# Patient Record
Sex: Male | Born: 1965 | Hispanic: No | Marital: Married | Smoking: Never smoker
Health system: Southern US, Community
[De-identification: ages and names within clinical notes are randomized; demographics above are authoritative.]

## PROBLEM LIST (undated history)

## (undated) DIAGNOSIS — I10 Essential (primary) hypertension: Secondary | ICD-10-CM

## (undated) HISTORY — PX: FOOT SURGERY: SHX648

---

## 1997-07-08 ENCOUNTER — Inpatient Hospital Stay (HOSPITAL_COMMUNITY): Admission: EM | Admit: 1997-07-08 | Discharge: 1997-07-12 | Payer: Self-pay

## 2006-11-23 ENCOUNTER — Emergency Department (HOSPITAL_COMMUNITY): Admission: EM | Admit: 2006-11-23 | Discharge: 2006-11-23 | Payer: Self-pay | Admitting: Emergency Medicine

## 2008-10-24 IMAGING — CR DG KNEE COMPLETE 4+V*L*
4 series · 4 of 4 positions shown · non-contrast
Comparison: None.

CLINICAL DATA: Pain and swelling in left knee.  Difficulty walking.
 LEFT KNEE ? 4 VIEW ? 11/23/06:

[t knee ap left]
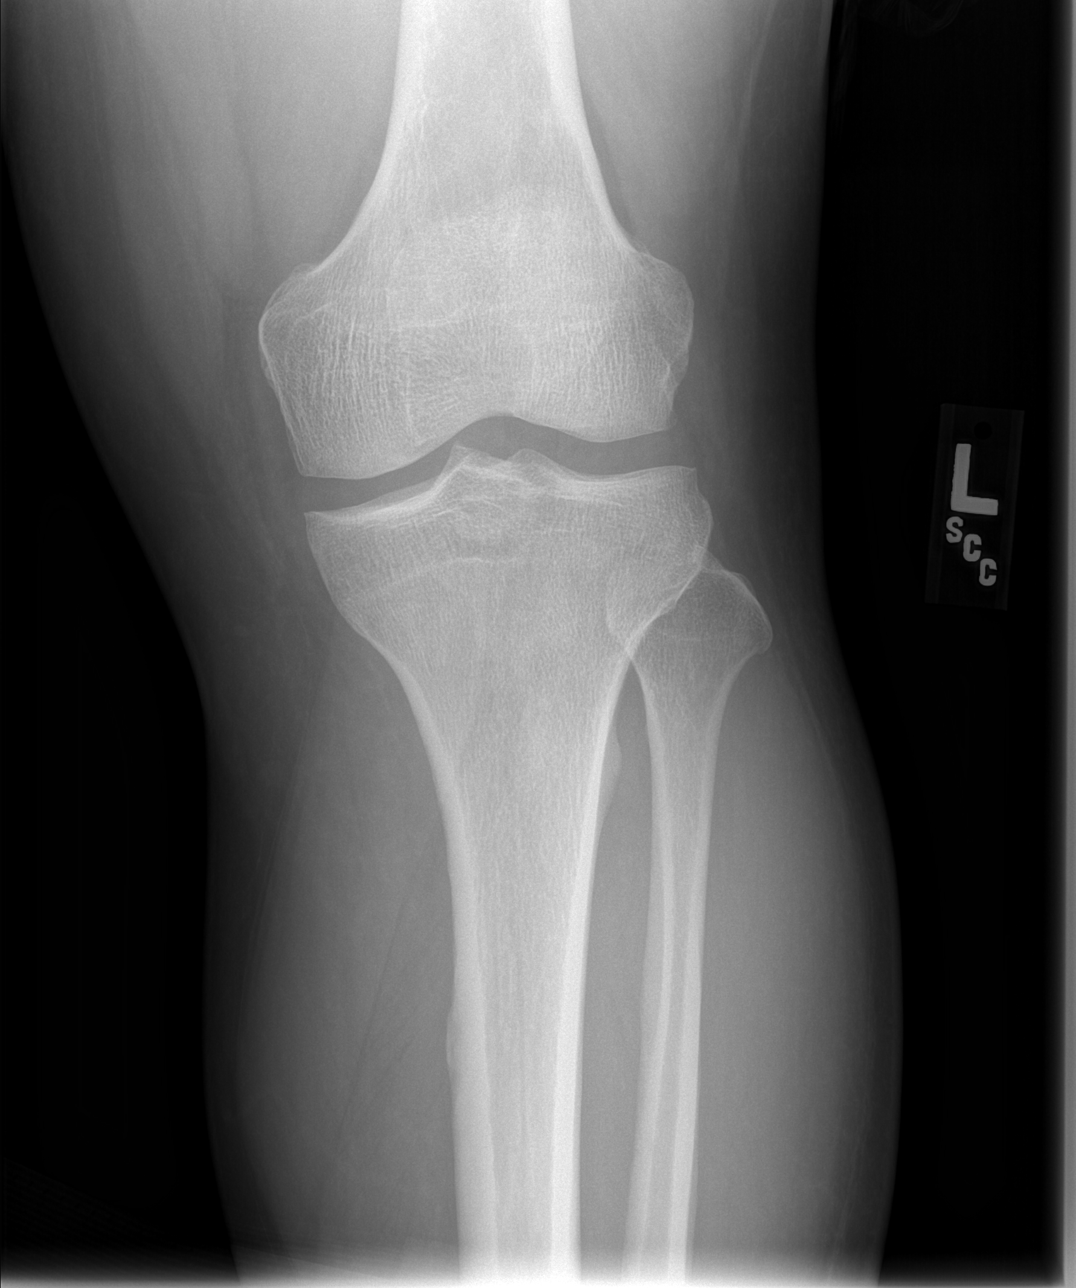

[t knee oblique left (1 of 2)]
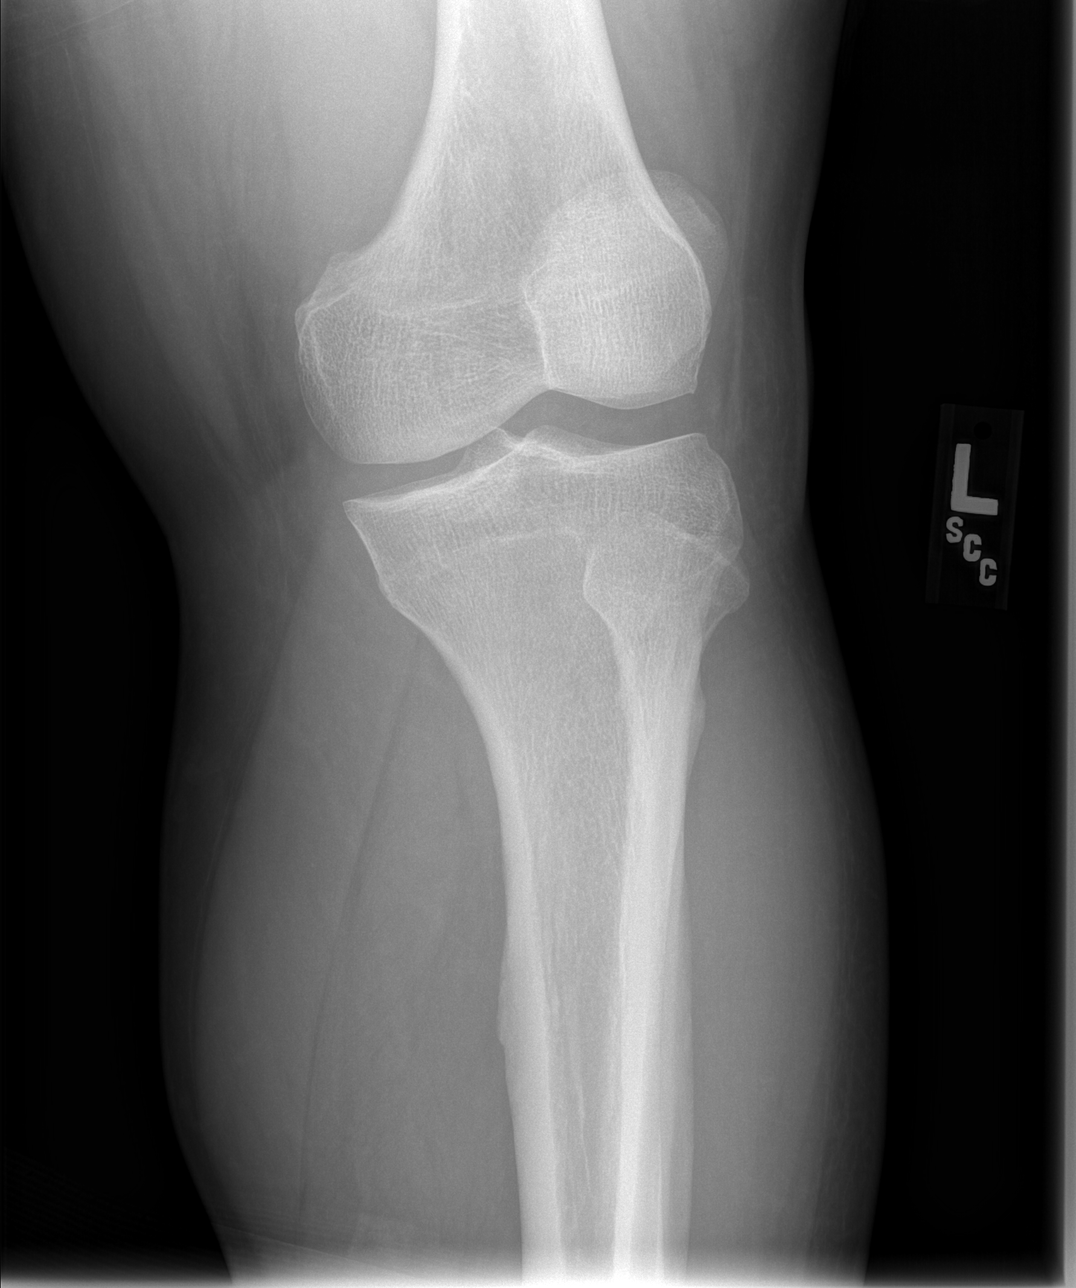

[t knee oblique left (2 of 2)]
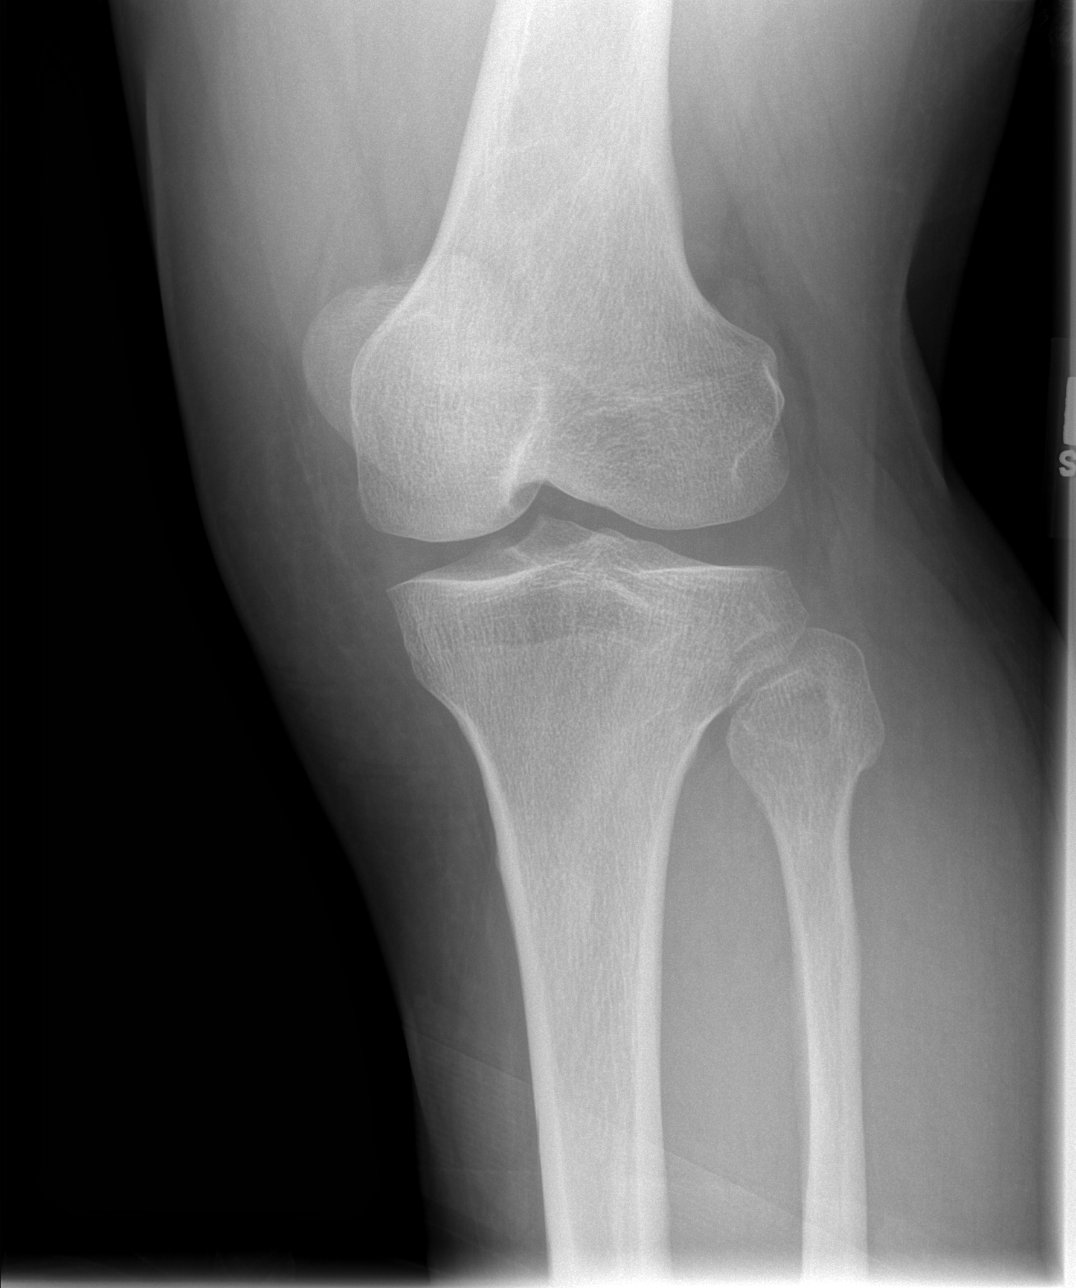

[t knee lat left]
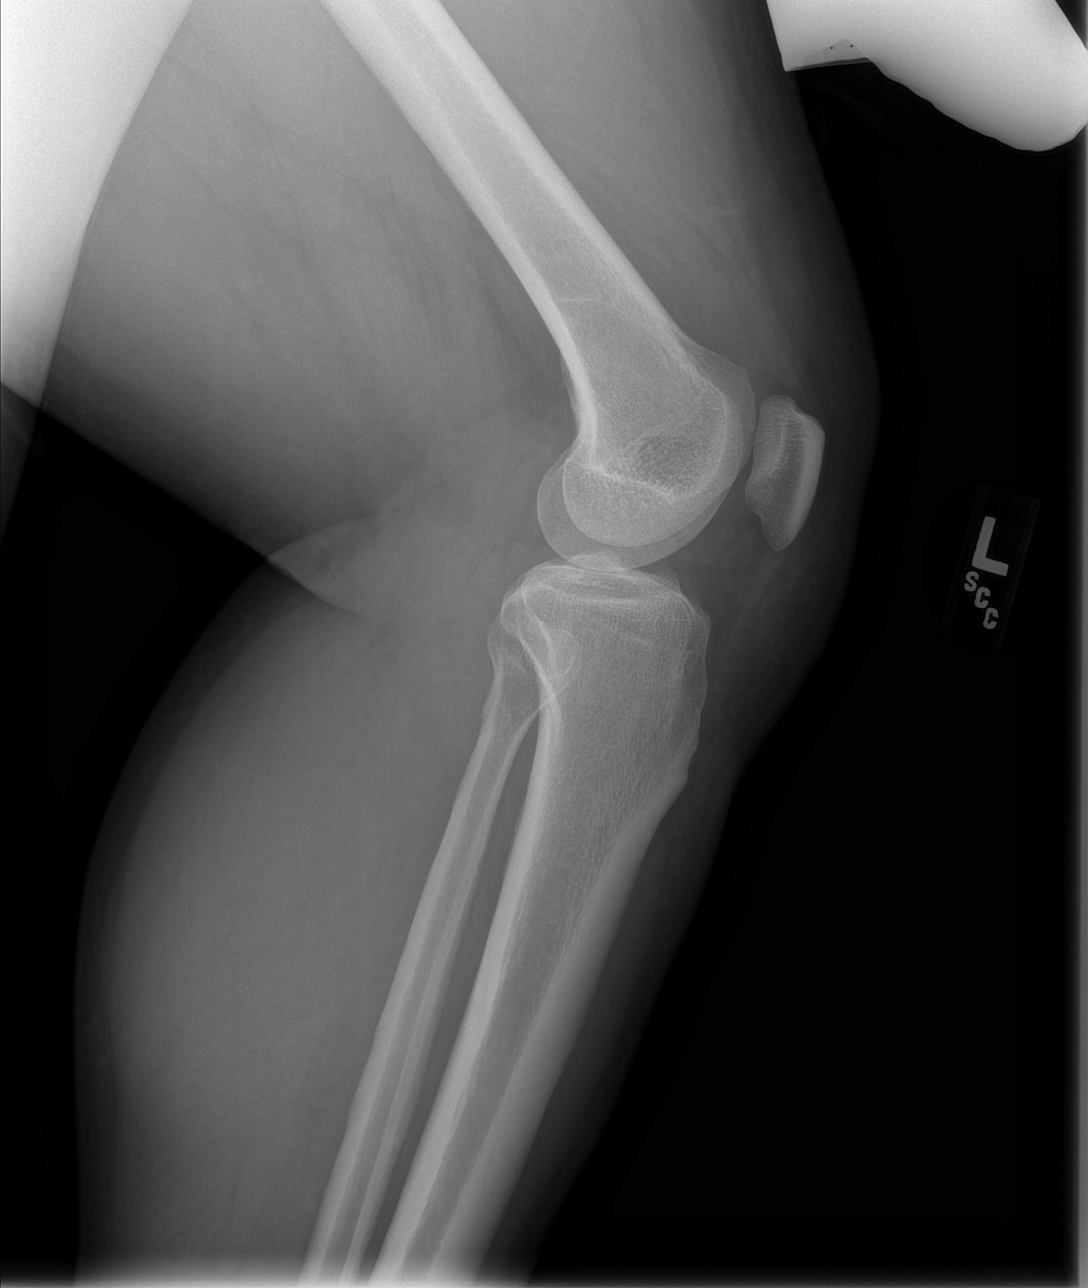

[4 of 4 positions shown; findings below may reference images not displayed]

FINDINGS: No definite joint effusion.  No fracture.  Mild subchondral sclerosis in the medial compartment.
IMPRESSION: No acute findings.   Minimal degenerative change.

## 2015-03-20 ENCOUNTER — Encounter (HOSPITAL_COMMUNITY): Payer: Self-pay

## 2015-03-20 ENCOUNTER — Emergency Department (HOSPITAL_COMMUNITY)
Admission: EM | Admit: 2015-03-20 | Discharge: 2015-03-20 | Disposition: A | Discharge status: Home / Self Care | Payer: Self-pay | Attending: Emergency Medicine | Admitting: Emergency Medicine

## 2015-03-20 DIAGNOSIS — B351 Tinea unguium: Secondary | ICD-10-CM | POA: Insufficient documentation

## 2015-03-20 HISTORY — DX: Essential (primary) hypertension: I10

## 2015-03-20 MED ORDER — FLUCONAZOLE 150 MG PO TABS: 150.0000 mg | ORAL_TABLET | ORAL | Status: DC
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
## 2015-03-20 NOTE — ED Provider Notes (Signed)
CSN: [id number]     Arrival date & time 03/20/15  1012 History   First MD Initiated Contact with Patient 03/20/15 1014     Chief Complaint  Patient presents with  . Hypertension   (Consider location/radiation/quality/duration/timing/severity/associated sxs/prior Treatment) HPI 50 y.o. male with a hx of HTN, presents to the Emergency Department today complaining of high blood pressure x8 months. He has been seen by a PCP for regular check ups every 3 months for refills on his BP medications. He currently takes  HCTZ PO once daily. BP today is 148/80. No headache, no NV. No CP/SOB/ABD pain. No numbness/tingling. Otherwise asymptomatic.  Pt does have additional complaint of a fungus on his right foot. 1st digit. No itching. No pain. Noticed it a few weeks ago.    Past Medical History  Diagnosis Date  . Hypertension    Past Surgical History  Procedure Laterality Date  . Foot surgery     History reviewed. No pertinent family history. Social History  Substance Use Topics  . Smoking status: Never Smoker   . Smokeless tobacco: None  . Alcohol Use: No    Review of Systems ROS reviewed and all are negative for acute change except as noted in the HPI.  Allergies  Review of patient's allergies indicates no known allergies.  Home Medications   Prior to Admission medications   Not on File   BP 177/92 mmHg  Pulse 71  Temp(Src) 97.8 F (36.6 C) (Oral)  Resp 16  SpO2 99%   Physical Exam  Constitutional: He is oriented to person, place, and time. He appears well-developed and well-nourished.  HENT:  Head: Normocephalic and atraumatic.  Eyes: EOM are normal.  Neck: Normal range of motion.  Cardiovascular: Normal rate, regular rhythm and normal heart sounds.   Pulmonary/Chest: Effort normal and breath sounds normal.  Abdominal: Soft.  Musculoskeletal: Normal range of motion.  Neurological: He is alert and oriented to person, place, and time.  Skin: Skin is warm and dry.   Psychiatric: He has a normal mood and affect. His behavior is normal. Thought content normal.  Nursing note and vitals reviewed.   ED Course  Procedures (including critical care time) Labs Review Labs Reviewed - No data to display  Imaging Review No results found. I have personally reviewed and evaluated these images and lab results as part of my medical decision-making.   EKG Interpretation None      MDM  I have reviewed the relevant previous healthcare records. I obtained HPI from historian.  ED Course:  Assessment: 54y M with hx htn presents with high blood pressure. Pt is being followed by a PCP regularly for check ups every 3 months. Pt has medications. Currently takes HCTZ  PO Daily. VSS with BP at 148/80 on exam. No headache, vision changes, CP/SOB. Otherwise asymptomatic Told to follow up with PCP for further management. Told to record blood pressures throughout the day to help PCP with dosages of his medications. Pt also had onychomycosis of right foot 1st digit. Will dc with antifungal. Patient is in no acute distress. Vital Signs are stable. Patient is able to ambulate. Patient able to tolerate PO.    Disposition/Plan:  DC Home Additional Verbal discharge instructions given and discussed with patient.  Pt Instructed to f/u with PCP in the next 48 hours for evaluation and treatment of symptoms. Return precautions given Pt acknowledges and agrees with plan  Supervising Physician Loren Racer, MD   Final diagnoses:  Onychomycosis  Audry Pili, PA-C 03/20/15 1103  Loren Racer, MD 03/20/15 [phone]

## 2016-07-27 ENCOUNTER — Ambulatory Visit (HOSPITAL_COMMUNITY)
Admission: EM | Admit: 2016-07-27 | Discharge: 2016-07-27 | Disposition: A | Discharge status: Home / Self Care | Payer: BLUE CROSS/BLUE SHIELD | Attending: Internal Medicine | Admitting: Internal Medicine

## 2016-07-27 ENCOUNTER — Encounter (HOSPITAL_COMMUNITY): Payer: Self-pay | Admitting: Emergency Medicine

## 2016-07-27 DIAGNOSIS — R739 Hyperglycemia, unspecified: Secondary | ICD-10-CM

## 2016-07-27 DIAGNOSIS — I10 Essential (primary) hypertension: Secondary | ICD-10-CM | POA: Insufficient documentation

## 2016-07-27 DIAGNOSIS — N390 Urinary tract infection, site not specified: Secondary | ICD-10-CM

## 2016-07-27 DIAGNOSIS — R300 Dysuria: Secondary | ICD-10-CM | POA: Diagnosis not present

## 2016-07-27 DIAGNOSIS — Z7984 Long term (current) use of oral hypoglycemic drugs: Secondary | ICD-10-CM | POA: Insufficient documentation

## 2016-07-27 LAB — POCT URINALYSIS DIP (DEVICE)
Bilirubin Urine: NEGATIVE
Glucose, UA: >=1000 mg/dL — AB
Ketones, ur: NEGATIVE mg/dL
NITRITE: NEGATIVE
PH: 5.5 (ref 5.0–8.0)
PROTEIN: NEGATIVE mg/dL
Specific Gravity, Urine: 1.01 (ref 1.005–1.030)
UROBILINOGEN UA: 0.2 mg/dL (ref 0.0–1.0)

## 2016-07-27 LAB — GLUCOSE, CAPILLARY: Glucose-Capillary: 408 mg/dL — ABNORMAL HIGH (ref 65–99)

## 2016-07-27 MED ORDER — METFORMIN HCL 500 MG PO TABS: 500.0000 mg | ORAL_TABLET | Freq: Two times a day (BID) | ORAL | 0 refills | Status: AC

## 2016-07-27 MED ORDER — CIPROFLOXACIN HCL 500 MG PO TABS: 500.0000 mg | ORAL_TABLET | Freq: Two times a day (BID) | ORAL | 0 refills | Status: AC

## 2016-07-27 NOTE — ED Triage Notes (Signed)
The patient presented to the [facility] with a complaint of dysuria x 3 days. 

## 2016-07-27 NOTE — ED Notes (Signed)
Importance  Of  followup   With a  pcp      Reviewed   With  Pt       Pt  Advised  To  Get meds  Filled  asap       Pt      verbalised  Knowledge

## 2016-07-27 NOTE — ED Provider Notes (Signed)
CSN: [id number]     Arrival date & time 07/27/16  1942 History   None    Chief Complaint  Patient presents with  . Dysuria   (Consider location/radiation/quality/duration/timing/severity/associated sxs/prior Treatment) [facility]   The history is provided by the patient.  Dysuria  This is a new problem. The problem occurs constantly.    Past Medical History:  Diagnosis Date  . Hypertension    Past Surgical History:  Procedure Laterality Date  . FOOT SURGERY     History reviewed. No pertinent family history. Social History  Substance Use Topics  . Smoking status: Never Smoker  . Smokeless tobacco: Not on file  . Alcohol use No    Review of Systems  Constitutional: Negative.   HENT: Negative.   Eyes: Negative.   Respiratory: Negative.   Cardiovascular: Negative.   Gastrointestinal: Negative.   Endocrine: Negative.   Genitourinary: Positive for dysuria.  Musculoskeletal: Negative.   Allergic/Immunologic: Negative.   Neurological: Negative.   Hematological: Negative.   Psychiatric/Behavioral: Negative.     Allergies  Patient has no known allergies.  Home Medications   Prior to Admission medications   Medication Sig Start Date End Date Taking? Authorizing Provider  ciprofloxacin (CIPRO) 500 MG tablet Take 1 tablet (500 mg total) by mouth 2 (two) times daily. 07/27/16   Deatra Canterxford, Josealfredo Adkins J, FNP  metFORMIN (GLUCOPHAGE) 500 MG tablet Take 1 tablet (500 mg total) by mouth 2 (two) times daily with a meal. 07/27/16   Zaelyn Barbary, Anselm PancoastWilliam J, FNP   Meds Ordered and Administered this Visit  Medications - No data to display  BP (!) 162/83 (BP Location: Right Arm)   Pulse 82   Temp 98.4 F (36.9 C) (Oral)   Resp 20   SpO2 96%  No data found.   Physical Exam  Constitutional: He is oriented to person, place, and time. He appears well-developed and well-nourished.  HENT:  Head: Normocephalic and atraumatic.  Eyes: Conjunctivae and EOM are  normal. Pupils are equal, round, and reactive to light.  Neck: Normal range of motion. Neck supple.  Cardiovascular: Normal rate, regular rhythm and normal heart sounds.   Pulmonary/Chest: Effort normal and breath sounds normal.  Neurological: He is alert and oriented to person, place, and time.  Nursing note and vitals reviewed.   Urgent Care Course     Procedures (including critical care time)  Labs Review Labs Reviewed  GLUCOSE, CAPILLARY - Abnormal; Notable for the following:       Result Value   Glucose-Capillary 408 (*)    All other components within normal limits  POCT URINALYSIS DIP (DEVICE) - Abnormal; Notable for the following:    Glucose, UA >=1000 (*)    Hgb urine dipstick SMALL (*)    Leukocytes, UA SMALL (*)    All other components within normal limits  URINE CULTURE    Imaging Review No results found.   Visual Acuity Review  Right Eye Distance:   Left Eye Distance:   Bilateral Distance:    Right Eye Near:   Left Eye Near:    Bilateral Near:         MDM   1. Hyperglycemia   2. Urinary tract infection without hematuria, site unspecified    Metformin 500mg  one po bid #60 Need PCP Referral made to Dr. Duanne Guessewey  Cipro 500mg  one po bid x 10 days UA cx      Deatra CanterOxford, Faigy Stretch J, FNP 07/27/16 2030

## 2016-07-29 LAB — URINE CULTURE

## 2016-08-16 DIAGNOSIS — I10 Essential (primary) hypertension: Secondary | ICD-10-CM | POA: Diagnosis not present

## 2016-08-16 DIAGNOSIS — E1165 Type 2 diabetes mellitus with hyperglycemia: Secondary | ICD-10-CM | POA: Diagnosis not present

## 2016-08-16 DIAGNOSIS — Z6841 Body Mass Index (BMI) 40.0 and over, adult: Secondary | ICD-10-CM | POA: Diagnosis not present

## 2016-08-16 DIAGNOSIS — Z23 Encounter for immunization: Secondary | ICD-10-CM | POA: Diagnosis not present

## 2016-09-13 DIAGNOSIS — Z6841 Body Mass Index (BMI) 40.0 and over, adult: Secondary | ICD-10-CM | POA: Diagnosis not present

## 2016-09-13 DIAGNOSIS — E1165 Type 2 diabetes mellitus with hyperglycemia: Secondary | ICD-10-CM | POA: Diagnosis not present

## 2016-09-13 DIAGNOSIS — I10 Essential (primary) hypertension: Secondary | ICD-10-CM | POA: Diagnosis not present

## 2016-10-14 DIAGNOSIS — Z6841 Body Mass Index (BMI) 40.0 and over, adult: Secondary | ICD-10-CM | POA: Diagnosis not present

## 2016-10-14 DIAGNOSIS — I10 Essential (primary) hypertension: Secondary | ICD-10-CM | POA: Diagnosis not present

## 2016-10-14 DIAGNOSIS — E1165 Type 2 diabetes mellitus with hyperglycemia: Secondary | ICD-10-CM | POA: Diagnosis not present

## 2016-12-22 DIAGNOSIS — E1165 Type 2 diabetes mellitus with hyperglycemia: Secondary | ICD-10-CM | POA: Diagnosis not present

## 2016-12-22 DIAGNOSIS — I10 Essential (primary) hypertension: Secondary | ICD-10-CM | POA: Diagnosis not present

## 2016-12-22 DIAGNOSIS — Z136 Encounter for screening for cardiovascular disorders: Secondary | ICD-10-CM | POA: Diagnosis not present

## 2016-12-29 DIAGNOSIS — I10 Essential (primary) hypertension: Secondary | ICD-10-CM | POA: Diagnosis not present

## 2016-12-29 DIAGNOSIS — Z6841 Body Mass Index (BMI) 40.0 and over, adult: Secondary | ICD-10-CM | POA: Diagnosis not present

## 2016-12-29 DIAGNOSIS — E1165 Type 2 diabetes mellitus with hyperglycemia: Secondary | ICD-10-CM | POA: Diagnosis not present

## 2016-12-29 DIAGNOSIS — Z23 Encounter for immunization: Secondary | ICD-10-CM | POA: Diagnosis not present

## 2017-01-10 DIAGNOSIS — Z961 Presence of intraocular lens: Secondary | ICD-10-CM | POA: Diagnosis not present

## 2017-01-10 DIAGNOSIS — H2512 Age-related nuclear cataract, left eye: Secondary | ICD-10-CM | POA: Diagnosis not present

## 2017-03-29 DIAGNOSIS — Z13220 Encounter for screening for lipoid disorders: Secondary | ICD-10-CM | POA: Diagnosis not present

## 2017-03-29 DIAGNOSIS — Z114 Encounter for screening for human immunodeficiency virus [HIV]: Secondary | ICD-10-CM | POA: Diagnosis not present

## 2017-03-29 DIAGNOSIS — Z125 Encounter for screening for malignant neoplasm of prostate: Secondary | ICD-10-CM | POA: Diagnosis not present

## 2017-03-29 DIAGNOSIS — Z0000 Encounter for general adult medical examination without abnormal findings: Secondary | ICD-10-CM | POA: Diagnosis not present

## 2017-03-29 DIAGNOSIS — Z1329 Encounter for screening for other suspected endocrine disorder: Secondary | ICD-10-CM | POA: Diagnosis not present

## 2017-04-04 DIAGNOSIS — Z0000 Encounter for general adult medical examination without abnormal findings: Secondary | ICD-10-CM | POA: Diagnosis not present

## 2017-04-04 DIAGNOSIS — E1165 Type 2 diabetes mellitus with hyperglycemia: Secondary | ICD-10-CM | POA: Diagnosis not present

## 2017-04-04 DIAGNOSIS — Z6841 Body Mass Index (BMI) 40.0 and over, adult: Secondary | ICD-10-CM | POA: Diagnosis not present

## 2017-04-04 DIAGNOSIS — E782 Mixed hyperlipidemia: Secondary | ICD-10-CM | POA: Diagnosis not present

## 2017-04-04 DIAGNOSIS — Z23 Encounter for immunization: Secondary | ICD-10-CM | POA: Diagnosis not present

## 2017-04-04 DIAGNOSIS — I10 Essential (primary) hypertension: Secondary | ICD-10-CM | POA: Diagnosis not present

## 2017-05-03 ENCOUNTER — Encounter: Payer: Self-pay | Admitting: Family Medicine

## 2017-05-04 DIAGNOSIS — Z23 Encounter for immunization: Secondary | ICD-10-CM | POA: Diagnosis not present

## 2017-05-04 DIAGNOSIS — E1165 Type 2 diabetes mellitus with hyperglycemia: Secondary | ICD-10-CM | POA: Diagnosis not present

## 2017-05-04 DIAGNOSIS — I10 Essential (primary) hypertension: Secondary | ICD-10-CM | POA: Diagnosis not present

## 2017-05-04 DIAGNOSIS — Z6841 Body Mass Index (BMI) 40.0 and over, adult: Secondary | ICD-10-CM | POA: Diagnosis not present

## 2017-07-06 DIAGNOSIS — E1165 Type 2 diabetes mellitus with hyperglycemia: Secondary | ICD-10-CM | POA: Diagnosis not present

## 2017-07-06 DIAGNOSIS — Z13220 Encounter for screening for lipoid disorders: Secondary | ICD-10-CM | POA: Diagnosis not present

## 2017-07-06 DIAGNOSIS — I10 Essential (primary) hypertension: Secondary | ICD-10-CM | POA: Diagnosis not present

## 2017-07-08 DIAGNOSIS — Z1211 Encounter for screening for malignant neoplasm of colon: Secondary | ICD-10-CM | POA: Diagnosis not present

## 2017-07-08 DIAGNOSIS — I10 Essential (primary) hypertension: Secondary | ICD-10-CM | POA: Diagnosis not present

## 2017-07-08 DIAGNOSIS — E1165 Type 2 diabetes mellitus with hyperglycemia: Secondary | ICD-10-CM | POA: Diagnosis not present

## 2017-07-08 DIAGNOSIS — E782 Mixed hyperlipidemia: Secondary | ICD-10-CM | POA: Diagnosis not present

## 2017-08-30 ENCOUNTER — Encounter: Payer: Self-pay | Admitting: Family Medicine

## 2017-09-30 DIAGNOSIS — E782 Mixed hyperlipidemia: Secondary | ICD-10-CM | POA: Diagnosis not present

## 2017-09-30 DIAGNOSIS — I10 Essential (primary) hypertension: Secondary | ICD-10-CM | POA: Diagnosis not present

## 2017-09-30 DIAGNOSIS — E1165 Type 2 diabetes mellitus with hyperglycemia: Secondary | ICD-10-CM | POA: Diagnosis not present

## 2017-09-30 DIAGNOSIS — Z0000 Encounter for general adult medical examination without abnormal findings: Secondary | ICD-10-CM | POA: Diagnosis not present

## 2017-10-03 DIAGNOSIS — Z1388 Encounter for screening for disorder due to exposure to contaminants: Secondary | ICD-10-CM | POA: Diagnosis not present

## 2017-10-03 DIAGNOSIS — Z77011 Contact with and (suspected) exposure to lead: Secondary | ICD-10-CM | POA: Diagnosis not present

## 2017-10-03 DIAGNOSIS — Z6841 Body Mass Index (BMI) 40.0 and over, adult: Secondary | ICD-10-CM | POA: Diagnosis not present

## 2017-10-03 DIAGNOSIS — Z23 Encounter for immunization: Secondary | ICD-10-CM | POA: Diagnosis not present

## 2017-10-03 DIAGNOSIS — E1165 Type 2 diabetes mellitus with hyperglycemia: Secondary | ICD-10-CM | POA: Diagnosis not present

## 2017-10-03 DIAGNOSIS — I10 Essential (primary) hypertension: Secondary | ICD-10-CM | POA: Diagnosis not present

## 2017-10-03 DIAGNOSIS — E782 Mixed hyperlipidemia: Secondary | ICD-10-CM | POA: Diagnosis not present

## 2017-11-03 DIAGNOSIS — Z77011 Contact with and (suspected) exposure to lead: Secondary | ICD-10-CM | POA: Diagnosis not present

## 2017-11-03 DIAGNOSIS — Z1388 Encounter for screening for disorder due to exposure to contaminants: Secondary | ICD-10-CM | POA: Diagnosis not present

## 2017-11-09 DIAGNOSIS — Z1388 Encounter for screening for disorder due to exposure to contaminants: Secondary | ICD-10-CM | POA: Diagnosis not present

## 2017-11-09 DIAGNOSIS — E1165 Type 2 diabetes mellitus with hyperglycemia: Secondary | ICD-10-CM | POA: Diagnosis not present

## 2017-11-09 DIAGNOSIS — I10 Essential (primary) hypertension: Secondary | ICD-10-CM | POA: Diagnosis not present

## 2017-11-09 DIAGNOSIS — E782 Mixed hyperlipidemia: Secondary | ICD-10-CM | POA: Diagnosis not present

## 2017-12-20 DIAGNOSIS — M79672 Pain in left foot: Secondary | ICD-10-CM | POA: Diagnosis not present

## 2017-12-21 DIAGNOSIS — Z4789 Encounter for other orthopedic aftercare: Secondary | ICD-10-CM | POA: Diagnosis not present

## 2017-12-21 DIAGNOSIS — M79672 Pain in left foot: Secondary | ICD-10-CM | POA: Diagnosis not present

## 2017-12-27 DIAGNOSIS — T84213A Breakdown (mechanical) of internal fixation device of bones of foot and toes, initial encounter: Secondary | ICD-10-CM | POA: Diagnosis not present

## 2017-12-27 DIAGNOSIS — T8469XA Infection and inflammatory reaction due to internal fixation device of other site, initial encounter: Secondary | ICD-10-CM | POA: Diagnosis not present

## 2018-01-01 DIAGNOSIS — M869 Osteomyelitis, unspecified: Secondary | ICD-10-CM | POA: Diagnosis not present

## 2018-01-01 DIAGNOSIS — T8469XA Infection and inflammatory reaction due to internal fixation device of other site, initial encounter: Secondary | ICD-10-CM | POA: Diagnosis not present

## 2018-01-01 DIAGNOSIS — T8484XA Pain due to internal orthopedic prosthetic devices, implants and grafts, initial encounter: Secondary | ICD-10-CM | POA: Diagnosis not present

## 2018-01-02 DIAGNOSIS — T8484XA Pain due to internal orthopedic prosthetic devices, implants and grafts, initial encounter: Secondary | ICD-10-CM | POA: Diagnosis not present

## 2018-01-05 DIAGNOSIS — M8600 Acute hematogenous osteomyelitis, unspecified site: Secondary | ICD-10-CM | POA: Diagnosis not present

## 2018-01-05 DIAGNOSIS — T8469XA Infection and inflammatory reaction due to internal fixation device of other site, initial encounter: Secondary | ICD-10-CM | POA: Diagnosis not present

## 2018-01-05 DIAGNOSIS — T8484XA Pain due to internal orthopedic prosthetic devices, implants and grafts, initial encounter: Secondary | ICD-10-CM | POA: Diagnosis not present

## 2018-01-09 DIAGNOSIS — T8469XA Infection and inflammatory reaction due to internal fixation device of other site, initial encounter: Secondary | ICD-10-CM | POA: Diagnosis not present

## 2018-01-10 DIAGNOSIS — M86672 Other chronic osteomyelitis, left ankle and foot: Secondary | ICD-10-CM | POA: Diagnosis not present

## 2018-01-10 DIAGNOSIS — T8469XA Infection and inflammatory reaction due to internal fixation device of other site, initial encounter: Secondary | ICD-10-CM | POA: Diagnosis not present

## 2018-01-13 DIAGNOSIS — M869 Osteomyelitis, unspecified: Secondary | ICD-10-CM | POA: Diagnosis not present

## 2018-01-13 DIAGNOSIS — T8469XA Infection and inflammatory reaction due to internal fixation device of other site, initial encounter: Secondary | ICD-10-CM | POA: Diagnosis not present

## 2018-01-13 DIAGNOSIS — T8484XA Pain due to internal orthopedic prosthetic devices, implants and grafts, initial encounter: Secondary | ICD-10-CM | POA: Diagnosis not present

## 2018-01-13 DIAGNOSIS — M79672 Pain in left foot: Secondary | ICD-10-CM | POA: Diagnosis not present

## 2018-01-17 DIAGNOSIS — M86672 Other chronic osteomyelitis, left ankle and foot: Secondary | ICD-10-CM | POA: Diagnosis not present

## 2018-01-17 DIAGNOSIS — M79672 Pain in left foot: Secondary | ICD-10-CM | POA: Diagnosis not present

## 2018-01-17 DIAGNOSIS — T8469XA Infection and inflammatory reaction due to internal fixation device of other site, initial encounter: Secondary | ICD-10-CM | POA: Diagnosis not present

## 2018-01-18 DIAGNOSIS — T8469XA Infection and inflammatory reaction due to internal fixation device of other site, initial encounter: Secondary | ICD-10-CM | POA: Diagnosis not present

## 2018-01-19 DIAGNOSIS — M86672 Other chronic osteomyelitis, left ankle and foot: Secondary | ICD-10-CM | POA: Diagnosis not present

## 2018-01-19 DIAGNOSIS — T8469XA Infection and inflammatory reaction due to internal fixation device of other site, initial encounter: Secondary | ICD-10-CM | POA: Diagnosis not present

## 2018-01-20 DIAGNOSIS — M869 Osteomyelitis, unspecified: Secondary | ICD-10-CM | POA: Diagnosis not present

## 2018-01-20 DIAGNOSIS — T8484XA Pain due to internal orthopedic prosthetic devices, implants and grafts, initial encounter: Secondary | ICD-10-CM | POA: Diagnosis not present

## 2018-01-20 DIAGNOSIS — T8469XA Infection and inflammatory reaction due to internal fixation device of other site, initial encounter: Secondary | ICD-10-CM | POA: Diagnosis not present

## 2018-01-20 DIAGNOSIS — M86672 Other chronic osteomyelitis, left ankle and foot: Secondary | ICD-10-CM | POA: Diagnosis not present

## 2018-01-23 DIAGNOSIS — T8469XA Infection and inflammatory reaction due to internal fixation device of other site, initial encounter: Secondary | ICD-10-CM | POA: Diagnosis not present

## 2018-01-23 DIAGNOSIS — M86672 Other chronic osteomyelitis, left ankle and foot: Secondary | ICD-10-CM | POA: Diagnosis not present

## 2018-01-23 DIAGNOSIS — M869 Osteomyelitis, unspecified: Secondary | ICD-10-CM | POA: Diagnosis not present

## 2018-01-23 DIAGNOSIS — T8484XA Pain due to internal orthopedic prosthetic devices, implants and grafts, initial encounter: Secondary | ICD-10-CM | POA: Diagnosis not present

## 2018-01-25 DIAGNOSIS — M86672 Other chronic osteomyelitis, left ankle and foot: Secondary | ICD-10-CM | POA: Diagnosis not present

## 2018-01-25 DIAGNOSIS — T8469XA Infection and inflammatory reaction due to internal fixation device of other site, initial encounter: Secondary | ICD-10-CM | POA: Diagnosis not present

## 2018-01-26 DIAGNOSIS — T8469XA Infection and inflammatory reaction due to internal fixation device of other site, initial encounter: Secondary | ICD-10-CM | POA: Diagnosis not present

## 2018-01-28 DIAGNOSIS — T8469XA Infection and inflammatory reaction due to internal fixation device of other site, initial encounter: Secondary | ICD-10-CM | POA: Diagnosis not present

## 2018-01-28 DIAGNOSIS — M869 Osteomyelitis, unspecified: Secondary | ICD-10-CM | POA: Diagnosis not present

## 2018-01-28 DIAGNOSIS — T8484XA Pain due to internal orthopedic prosthetic devices, implants and grafts, initial encounter: Secondary | ICD-10-CM | POA: Diagnosis not present

## 2018-01-31 DIAGNOSIS — B999 Unspecified infectious disease: Secondary | ICD-10-CM | POA: Diagnosis not present

## 2018-01-31 DIAGNOSIS — T8469XA Infection and inflammatory reaction due to internal fixation device of other site, initial encounter: Secondary | ICD-10-CM | POA: Diagnosis not present

## 2018-01-31 DIAGNOSIS — M869 Osteomyelitis, unspecified: Secondary | ICD-10-CM | POA: Diagnosis not present

## 2018-02-01 DIAGNOSIS — E1165 Type 2 diabetes mellitus with hyperglycemia: Secondary | ICD-10-CM | POA: Diagnosis not present

## 2018-02-01 DIAGNOSIS — E782 Mixed hyperlipidemia: Secondary | ICD-10-CM | POA: Diagnosis not present

## 2018-02-01 DIAGNOSIS — M869 Osteomyelitis, unspecified: Secondary | ICD-10-CM | POA: Diagnosis not present

## 2018-02-01 DIAGNOSIS — T8469XA Infection and inflammatory reaction due to internal fixation device of other site, initial encounter: Secondary | ICD-10-CM | POA: Diagnosis not present

## 2018-02-01 DIAGNOSIS — I10 Essential (primary) hypertension: Secondary | ICD-10-CM | POA: Diagnosis not present

## 2018-02-01 DIAGNOSIS — Z1388 Encounter for screening for disorder due to exposure to contaminants: Secondary | ICD-10-CM | POA: Diagnosis not present

## 2018-02-04 DIAGNOSIS — M8600 Acute hematogenous osteomyelitis, unspecified site: Secondary | ICD-10-CM | POA: Diagnosis not present

## 2018-02-04 DIAGNOSIS — T8484XA Pain due to internal orthopedic prosthetic devices, implants and grafts, initial encounter: Secondary | ICD-10-CM | POA: Diagnosis not present

## 2018-02-04 DIAGNOSIS — M86672 Other chronic osteomyelitis, left ankle and foot: Secondary | ICD-10-CM | POA: Diagnosis not present

## 2018-02-06 DIAGNOSIS — E1165 Type 2 diabetes mellitus with hyperglycemia: Secondary | ICD-10-CM | POA: Diagnosis not present

## 2018-02-06 DIAGNOSIS — I10 Essential (primary) hypertension: Secondary | ICD-10-CM | POA: Diagnosis not present

## 2018-02-06 DIAGNOSIS — M86672 Other chronic osteomyelitis, left ankle and foot: Secondary | ICD-10-CM | POA: Diagnosis not present

## 2018-02-06 DIAGNOSIS — E782 Mixed hyperlipidemia: Secondary | ICD-10-CM | POA: Diagnosis not present

## 2018-03-15 DIAGNOSIS — B351 Tinea unguium: Secondary | ICD-10-CM | POA: Diagnosis not present

## 2018-03-15 DIAGNOSIS — E782 Mixed hyperlipidemia: Secondary | ICD-10-CM | POA: Diagnosis not present

## 2018-03-15 DIAGNOSIS — I10 Essential (primary) hypertension: Secondary | ICD-10-CM | POA: Diagnosis not present

## 2018-03-15 DIAGNOSIS — E1165 Type 2 diabetes mellitus with hyperglycemia: Secondary | ICD-10-CM | POA: Diagnosis not present

## 2018-03-15 DIAGNOSIS — Z23 Encounter for immunization: Secondary | ICD-10-CM | POA: Diagnosis not present

## 2018-03-15 DIAGNOSIS — M86672 Other chronic osteomyelitis, left ankle and foot: Secondary | ICD-10-CM | POA: Diagnosis not present

## 2018-03-15 DIAGNOSIS — Z6841 Body Mass Index (BMI) 40.0 and over, adult: Secondary | ICD-10-CM | POA: Diagnosis not present

## 2018-04-05 DIAGNOSIS — Z1329 Encounter for screening for other suspected endocrine disorder: Secondary | ICD-10-CM | POA: Diagnosis not present

## 2018-04-05 DIAGNOSIS — Z0000 Encounter for general adult medical examination without abnormal findings: Secondary | ICD-10-CM | POA: Diagnosis not present

## 2018-04-05 DIAGNOSIS — Z114 Encounter for screening for human immunodeficiency virus [HIV]: Secondary | ICD-10-CM | POA: Diagnosis not present

## 2018-04-05 DIAGNOSIS — Z125 Encounter for screening for malignant neoplasm of prostate: Secondary | ICD-10-CM | POA: Diagnosis not present

## 2018-04-07 DIAGNOSIS — Z6841 Body Mass Index (BMI) 40.0 and over, adult: Secondary | ICD-10-CM | POA: Diagnosis not present

## 2018-04-07 DIAGNOSIS — Z23 Encounter for immunization: Secondary | ICD-10-CM | POA: Diagnosis not present

## 2018-04-07 DIAGNOSIS — Z0000 Encounter for general adult medical examination without abnormal findings: Secondary | ICD-10-CM | POA: Diagnosis not present

## 2018-04-22 DIAGNOSIS — H2513 Age-related nuclear cataract, bilateral: Secondary | ICD-10-CM | POA: Diagnosis not present

## 2018-04-22 DIAGNOSIS — H40033 Anatomical narrow angle, bilateral: Secondary | ICD-10-CM | POA: Diagnosis not present

## 2018-04-26 DIAGNOSIS — M79672 Pain in left foot: Secondary | ICD-10-CM | POA: Diagnosis not present

## 2018-04-26 DIAGNOSIS — M86672 Other chronic osteomyelitis, left ankle and foot: Secondary | ICD-10-CM | POA: Diagnosis not present

## 2018-04-26 DIAGNOSIS — B351 Tinea unguium: Secondary | ICD-10-CM | POA: Diagnosis not present

## 2018-07-03 DIAGNOSIS — H2522 Age-related cataract, morgagnian type, left eye: Secondary | ICD-10-CM | POA: Diagnosis not present

## 2018-07-03 DIAGNOSIS — E119 Type 2 diabetes mellitus without complications: Secondary | ICD-10-CM | POA: Diagnosis not present

## 2018-07-21 DIAGNOSIS — E1165 Type 2 diabetes mellitus with hyperglycemia: Secondary | ICD-10-CM | POA: Diagnosis not present

## 2018-07-21 DIAGNOSIS — I10 Essential (primary) hypertension: Secondary | ICD-10-CM | POA: Diagnosis not present

## 2018-08-14 DIAGNOSIS — I10 Essential (primary) hypertension: Secondary | ICD-10-CM | POA: Diagnosis not present

## 2018-08-14 DIAGNOSIS — E1165 Type 2 diabetes mellitus with hyperglycemia: Secondary | ICD-10-CM | POA: Diagnosis not present

## 2018-08-31 DIAGNOSIS — H2512 Age-related nuclear cataract, left eye: Secondary | ICD-10-CM | POA: Diagnosis not present

## 2018-08-31 DIAGNOSIS — H2513 Age-related nuclear cataract, bilateral: Secondary | ICD-10-CM | POA: Diagnosis not present

## 2018-09-11 DIAGNOSIS — H2512 Age-related nuclear cataract, left eye: Secondary | ICD-10-CM | POA: Diagnosis not present

## 2018-09-11 DIAGNOSIS — M86672 Other chronic osteomyelitis, left ankle and foot: Secondary | ICD-10-CM | POA: Diagnosis not present

## 2018-09-11 DIAGNOSIS — Z79899 Other long term (current) drug therapy: Secondary | ICD-10-CM | POA: Diagnosis not present

## 2018-09-11 DIAGNOSIS — E1136 Type 2 diabetes mellitus with diabetic cataract: Secondary | ICD-10-CM | POA: Diagnosis not present

## 2018-09-11 DIAGNOSIS — Z7984 Long term (current) use of oral hypoglycemic drugs: Secondary | ICD-10-CM | POA: Diagnosis not present

## 2018-10-02 ENCOUNTER — Other Ambulatory Visit: Payer: Self-pay

## 2018-10-02 DIAGNOSIS — Z20822 Contact with and (suspected) exposure to covid-19: Secondary | ICD-10-CM

## 2018-10-04 LAB — NOVEL CORONAVIRUS, NAA: SARS-CoV-2, NAA: NOT DETECTED

## 2018-10-06 ENCOUNTER — Telehealth: Payer: Self-pay | Admitting: General Practice

## 2018-10-06 NOTE — Telephone Encounter (Signed)
Negative COVID results given. Patient results "NOT Detected." Caller expressed understanding. ° °

## 2018-10-09 ENCOUNTER — Other Ambulatory Visit: Payer: Self-pay

## 2018-10-09 DIAGNOSIS — Z20822 Contact with and (suspected) exposure to covid-19: Secondary | ICD-10-CM

## 2018-10-10 LAB — NOVEL CORONAVIRUS, NAA: SARS-CoV-2, NAA: NOT DETECTED
